# Patient Record
Sex: Female | Born: 2000 | Race: White | Hispanic: No | Marital: Single | Smoking: Never smoker
Health system: Southern US, Community
[De-identification: ages and names within clinical notes are randomized; demographics above are authoritative.]

## PROBLEM LIST (undated history)

## (undated) DIAGNOSIS — G43909 Migraine, unspecified, not intractable, without status migrainosus: Secondary | ICD-10-CM

## (undated) HISTORY — PX: KNEE ARTHROSCOPY WITH POSTERIOR CRUCIATE LIGAMENT (PCL) RECONSTRUCTION: SHX5657

---
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
## 2020-01-17 ENCOUNTER — Other Ambulatory Visit: Payer: Self-pay

## 2020-01-17 ENCOUNTER — Encounter: Payer: Medicaid Other | Attending: Pediatrics | Admitting: Registered"

## 2020-01-17 ENCOUNTER — Encounter: Payer: Self-pay | Admitting: Registered"

## 2020-01-17 DIAGNOSIS — E663 Overweight: Secondary | ICD-10-CM | POA: Diagnosis present

## 2020-01-17 DIAGNOSIS — E7800 Pure hypercholesterolemia, unspecified: Secondary | ICD-10-CM | POA: Diagnosis not present

## 2020-01-17 NOTE — Progress Notes (Signed)
Medical Nutrition Therapy:  Appt start time: 9983 end time:  1507.  Assessment:  Primary concerns today: Pt referred for weight management and elevated cholesterol. Pt present for appointment alone.  Pt reports her mother is usually the one to ask questions at pt's appointments. Reports when the doctor called she figured she may have HTN or HLD due to family hx. Pt reports her mother has been making changes to the foods they have to help prevent heart disease and diabetes due to their family hx including both. Reports mother using liquid cooking oils in place of butter and trying to include lean proteins.   Pt reports she struggles with trying new vegetables because she was pressured to eat them when she was a child and did not like them. Pt is open to trying some new vegetables now.   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 Pt reports she is currently doing well since finding out what foods irritate her GI and working to limit/avoid those foods. Pt reports spicy, greasy/high fat, and acidic foods such as tomato sauce as well as soda (carbonated beverages) were causing a lot of stomach issues. Also has issues if she consumes too much milk at one time. Pt has not tried lactose free milk or any milk alternatives. Pt reports she made these change over past 1.5 years and has been feeling much better. Pt reports she was told she probably had SIBO since age 57. Reports if her family is having spaghetti or pizza she will limit portions. Reports sweets sometimes trigger migraines if in large amounts. Pt reports things like nuts and nut butters are well tolerated.   Pt reports she was told to get in 3 meals by her doctor but struggles to do so due to not being used to eating reguarly in the past. Reports she is trying to get used to regular pattern of eating. Pt reports she is supposed to take a multivitamin but hasn't been taking it regularly. Reports the multivitamin contains iron and  calcium.   Pt is doing virtual school. Reports she is on spring break this week.   Food Allergies/Intolerances: No food allergies. Pt reports spicy, acidic, greasy/high fat foods and carbonated drinks irritate GI. Limits amount of dairy at one time as well.   GI Concerns: None currently. Hx of SIBO and several foods that cause GI discomfort but reports feeling much better over past 1.5 years since making dietary changes.   Pertinent Lab Values: Pending additional labs.  11/15/19:  Hemoglobin: 11.9  Weight Hx: See growth chart.   Preferred Learning Style:   No preference indicated   Learning Readiness:   Ready  MEDICATIONS: See list.    DIETARY INTAKE:  Usual eating pattern includes 2 meals and 1 snack per day. Pt reports getting in 3 meals has been a struggle due to hx with GI problems. Reports she is just now getting used to being able to eat regularly. Reports variances in appetite.   Common foods: foods vary.  Avoided foods: chips (limits), spicy (limits), acidic (limits), greasy foods (limits), carbonated beverages (limits).  Liked vegetables: carrots, green beans.   Typical Snacks: popcorn, granola bar, yogurt, gummies.   Typical Beverages: water.   Location of Meals: with family.  Electronics Present at [employer]: Yes: TV in background  24-hr recall:  B ( AM): 2 pancakes with syrup, bowl of tangerines, chocolate milk (reports started feeling a little sick) Snk ( AM): None reported.  L ( PM): 1  slice [employer] pizza pepperoni, bottle of water (felt a little nauseous)  Snk ( PM): None reported.  D ( PM): tacos x 2-3 hard and soft with beef, lettuce, cheese, sweet tea (no GI issues)  Snk ( PM): handful gummy bears  Beverages: chocolate milk (part of cup), half of a bottle soda, ~ 8 oz sweet tea, 2-3 bottles water/day   Usual physical activity: running around with dogs; no planned activities; walks at park with siblings. Minutes/Week: 2-3 days x 1.5 hours  per week.   Progress Towards Goal(s):  In progress.   Nutritional Diagnosis:  NI-5.11.1 Predicted suboptimal nutrient intake As related to skipping meals, inadequate intake of vegetables.  As evidenced by pt's reported dietary recall and habits .    Intervention:  Nutrition counseling provided. Dietitian provided education regarding heart healthy nutrition and nutrition to promote healthy microbiota. Discussed continuing to limit foods found to trigger GI discomfort. Discussed benefits of physical activity on heart health-recommended consulting pt's doctor with any questions/concerns about if an activity is appropriate for knee. Discussed goal for trying new vegetables. Discussed having protein with carbohydrate snacks which may slow blood sugar rise and prevent spiking which could possibly be triggering migraines. Discussed importance of getting in multiple meals per day to help with preventing GI discomfort. Discussed trying a lactose free milk as lactose intolerance is common with SIBO. Recommended multivitamin with iron. Pt appeared agreeable to information/goals discussed.   Instructions/Goals:  Make sure to get in three meals per day. Try to have balanced meals like the My Plate example (see handout). Include lean proteins, vegetables, fruits, and whole grains at meals.    Have 3 meals: If unable to get a full meal, have a balanced snack (see handout). Eat every 3-5 hours while awake.   If having a carbohydrate or sweet, balance with protein which may help prevent migraines    Continue working to limit high fat foods, especially those high in saturated fats (see handout), and others that cause GI distress. Include those with good sources of unsaturated fat as main fat source.    Recommend setting out to try 1-2 new vegetables every 1-2 weeks.   -Recommend trying a lactose free milk  Lactaid   Soy milk  Pea Milk   Silk Protein Plus  -Continue including yogurt with healthy  bacteria. Can also try Kefir drinks  -Recommend a daily multivitamin with iron.   -Recommend ~64 oz water (4 bottles) daily  -Continue working to increase physical activity. Trying for at least 30 minutes most days of the week is a great goal.   Teaching Method Utilized:  Visual Auditory  Handouts given during visit include:  Balanced plate and food list  Heart Healthy Nutrition   Balanced Snacks.   Barriers to learning/adherence to lifestyle change: None indicated.  Demonstrated degree of understanding via:  Teach Back   Monitoring/Evaluation:  Dietary intake, exercise,and body weight in 2 month(s).

## 2020-01-17 NOTE — Patient Instructions (Addendum)
Instructions/Goals:  Make sure to get in three meals per day. Try to have balanced meals like the My Plate example (see handout). Include lean proteins, vegetables, fruits, and whole grains at meals.    Have 3 meals: If unable to get a full meal, have a balanced snack (see handout). Eat every 3-5 hours while awake.   If having a carbohydrate or sweet, balance with protein which may help prevent migraines    Continue working to limit high fat foods, especially those high in saturated fats (see handout), and others that cause GI distress. Include those with good sources of unsaturated fat as main fat source.    Recommend setting out to try 1-2 new vegetables every 1-2 weeks.   -Recommend trying a lactose free milk  Lactaid   Soy milk  Pea Milk   Silk Protein Plus  -Continue including yogurt with healthy bacteria. Can also try Kefir drinks  -Recommend a daily multivitamin with iron.   -Recommend ~64 oz water (4 bottles) daily  -Continue working to increase physical activity. Trying for at least 30 minutes most days of the week is a great goal.

## 2020-03-20 ENCOUNTER — Encounter: Payer: Self-pay | Admitting: Registered"

## 2020-03-20 ENCOUNTER — Encounter: Payer: Medicaid Other | Attending: Pediatrics | Admitting: Registered"

## 2020-03-20 ENCOUNTER — Other Ambulatory Visit: Payer: Self-pay

## 2020-03-20 DIAGNOSIS — E663 Overweight: Secondary | ICD-10-CM | POA: Diagnosis present

## 2020-03-20 DIAGNOSIS — E7800 Pure hypercholesterolemia, unspecified: Secondary | ICD-10-CM | POA: Diagnosis present

## 2020-03-20 NOTE — Progress Notes (Signed)
Medical Nutrition Therapy:  Appt start time: 5681 end time:  1435.  Assessment:  Primary concerns today: Pt referred for weight management and elevated cholesterol.   Nutrition Follow-Up: Pt present for appointment alone.  Pt reports she has started the supplement with iron. Reports she has been having more energy since getting in 3 meals per day. Reports she started with 2 meals daily and then worked to 3 meals. Reports a program at school helped her get in 3 meals and since she finished school, if she doesn't stop to eat she will get hungry or get a headache so that has encouraged pt to keep up the 3 meals per day. Reports she is drinking mostly water, very little soda. Reports around 5 bottles water daily. Reports improvement in migraines since eating more often and having less soda. Reports things have been easier without the school stress, last day was last Sunday. Reports being very happy to learn she will be graduating. Reports having a lot of struggle with school work over past 4 years and glad she was able to pass everything to graduate. Pt reports her step father was dx with stage 4 throat cancer recently which has been difficult. Reports her mother had lung cancer in the past.   Pt reports she tried some new vegetables as discussed at last visit. She tried green peppers, cucumbers (used to hate as younger child), and radishes. Reports her mother is helping encourage her to try more vegetables as well. Reports she has now had the vegetables more than once. Reports they were ok, different. Pt reports her mother has been trying to help her make positive changes so she won't have to be put on cholesterol medication.   Pt reports she tried Kefir on Sunday and almond milk. Reports doing better on her stomach than regular milk. Reports the kefir was not bad. Plans to have it again. Pt reports she eats yogurt but not daily. Reports she does not consistently have dairy each day.   Food  Allergies/Intolerances: No food allergies. Pt reports spicy, acidic, greasy/high fat foods and carbonated drinks irritate GI. Limits amount of dairy at one time as well.   GI Concerns: None currently. Hx of SIBO and several foods that cause GI discomfort but reports feeling much better over past 1.5 years since making dietary changes. Pt reports occasional stomach pain but feels it is stress induced. Denies any other GI issues.   Pertinent Lab Values: Pending additional labs.  11/15/19:  Hemoglobin: 11.9  Weight Hx: See growth chart.   Preferred Learning Style:   No preference indicated   Learning Readiness:   Ready  MEDICATIONS: See list.    DIETARY INTAKE:  Usual eating pattern includes 3 meals and 1-2 snacks per day.   Common foods: foods vary.  Avoided foods: chips (limits), spicy (limits), acidic (limits), greasy foods (limits), carbonated beverages (limits).  Liked vegetables: carrots, green beans.    Typical Snacks: popcorn, trail mix, granola bar, yogurt, gummies.   Typical Beverages: water.   Location of Meals: with family.  Electronics Present at [employer]: Yes: TV in background  24-hr recall:  B ( AM): 1 blueberry muffin, apple slices  Snk ( AM): None reported.  L ( PM): mac and cheese, carrots Snk ( PM): trail mix (cashews, almonds, peanuts, chocolate) D ( PM): ham, mustard sandwich on white bread Snk ( PM): 1 pack dark chocolate cream Belvita cookies Beverages: 4 bottles water   Usual physical activity: running around with dogs;  no planned activities; walks at park with siblings. Minutes/Week: Reports 30-45 minutes per day walking dog. Reports activity has increased since last appointment.   Progress Towards Goal(s):  Some progress. Pt now getting in 3 meals most days and tried multiple new vegetables.    Nutritional Diagnosis:  NI-5.11.1 Predicted suboptimal nutrient intake As related to skipping meals, inadequate intake of vegetables.  As evidenced by  pt's reported dietary recall and habits .    Intervention:  Nutrition counseling provided. Praised pt's progress with getting in 3 meals, more water, and trying the kefir, alternative milk, and new vegetables. Discussed continuing with previously discussed goals. Recommended adding a calcium supplement, 500 mg 1 time daily if having some dairy each day and 2 separately daily if not getting in any dairy sources some days. Pt appeared agreeable to information/goals discussed.   Instructions/Goals:  Make sure to get in three meals per day. Try to have balanced meals like the My Plate example (see handout). Include lean proteins, vegetables, fruits, and whole grains at meals.    Have 3 meals: If unable to get a full meal, have a balanced snack (see handout). Eat every 3-5 hours while awake.Great job having 3 meals per day!! Keep it up!!   Continue working to limit high fat foods, especially those high in saturated fats (see handout), and others that cause GI distress. Include those with good sources of unsaturated fat as main fat source.    Recommend setting out to try 1-2 new vegetables every 1-2 weeks. Great job trying new vegetables!   -Recommend trying a lactose free milk Good job trying the almond milk!  Lactaid   Soy milk  Pea Milk   Silk Protein Plus  -Continue including yogurt with healthy bacteria. Can also try Kefir drinks Great job trying Kefir!!  -Recommend a daily multivitamin with iron. Great job!   -Recommend including 500 mg calcium if unable to get in 3 servings of dairy per day but still getting some dairy daily and recommend 500 mg twice daily separately if not having any dairy some days. Take separately from when you take the iron multivitamin.   -Recommend ~64 oz water (4 bottles) daily Doing awesome with your water!!  -Continue working to increase physical activity. Trying for at least 30 minutes most days of the week is a great goal. Wonderful job!   Teaching  Method Utilized:  Visual Auditory  Barriers to learning/adherence to lifestyle change: None indicated.  Demonstrated degree of understanding via:  Teach Back   Monitoring/Evaluation:  Dietary intake, exercise,and body weight in 3 month(s).

## 2020-03-20 NOTE — Patient Instructions (Addendum)
Instructions/Goals:  Make sure to get in three meals per day. Try to have balanced meals like the My Plate example (see handout). Include lean proteins, vegetables, fruits, and whole grains at meals.    Have 3 meals: If unable to get a full meal, have a balanced snack (see handout). Eat every 3-5 hours while awake.Great job having 3 meals per day!! Keep it up!!   Continue working to limit high fat foods, especially those high in saturated fats (see handout), and others that cause GI distress. Include those with good sources of unsaturated fat as main fat source.    Recommend setting out to try 1-2 new vegetables every 1-2 weeks. Great job trying new vegetables!   -Recommend trying a lactose free milk Good job trying the almond milk!  Lactaid   Soy milk  Pea Milk   Silk Protein Plus  -Continue including yogurt with healthy bacteria. Can also try Kefir drinks Great job trying Kefir!!  -Recommend a daily multivitamin with iron. Great job!   -Recommend including 500 mg calcium if unable to get in 3 servings of dairy per day but still getting some dairy daily and recommend 500 mg twice daily separately if not having any dairy some days. Take separately from when you take the iron multivitamin.   -Recommend ~64 oz water (4 bottles) daily Doing awesome with your water!!  -Continue working to increase physical activity. Trying for at least 30 minutes most days of the week is a great goal. Wonderful job!

## 2020-06-17 ENCOUNTER — Encounter: Payer: Medicaid Other | Attending: Pediatrics | Admitting: Registered"

## 2020-06-17 ENCOUNTER — Encounter: Payer: Self-pay | Admitting: Registered"

## 2020-06-17 ENCOUNTER — Other Ambulatory Visit: Payer: Self-pay

## 2020-06-17 DIAGNOSIS — E7800 Pure hypercholesterolemia, unspecified: Secondary | ICD-10-CM | POA: Diagnosis not present

## 2020-06-17 DIAGNOSIS — E663 Overweight: Secondary | ICD-10-CM | POA: Diagnosis present

## 2020-06-17 NOTE — Patient Instructions (Signed)
Instructions/Goals:  Make sure to get in three meals per day. Try to have balanced meals like the My Plate example (see handout). Include lean proteins, vegetables, fruits, and whole grains at meals.    Have 3 meals: If unable to get a full meal, have a balanced snack (see handout). Eat every 3-5 hours while awake.  Plan out easy go to foods for breakfast and lunch so you can easily still get in your meals even on busy days. Start with prepping 2 meals per week.    Continue working to limit high fat foods, especially those high in saturated fats (see handout), and others that cause GI distress. Include those with good sources of unsaturated fat as main fat source.    Recommend setting out to try 1-2 new vegetables every 1-2 weeks.  Continue working to include more non-starchy vegetables.   -Recommend trying a lactose free milk Continue working to get in more dairy!  Lactaid   Soy milk  Pea Milk   Silk Protein Plus   -Recommend a daily multivitamin with iron. Great job!   -Recommend including 500 mg calcium if unable to get in 3 servings of dairy per day but still getting some dairy daily and recommend 500 mg twice daily separately if not having any dairy some days. Take separately from when you take the iron multivitamin. ** May do Tums if easier than regular calcium supplements. **  -Recommend ~64 oz water (4 bottles) daily Doing awesome with your water!!  -Continue working to increase physical activity. Trying for at least 30 minutes most days of the week is a great goal. [employer] job!

## 2020-06-17 NOTE — Progress Notes (Signed)
Medical Nutrition Therapy:  Appt start time: 1403 end time:  1435.  Assessment:  Primary concerns today: Pt referred for weight management and elevated cholesterol.   Nutrition Follow-Up: Pt present for appointment alone.  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 Reports work has become a little harder but feels she is getting the hang of things.   Pt reports starting the program at school has made eating regular more difficult. Trying to get back on track. Reports she would get busy with school and not eat at regular times. Reports typically eating 2 times daily, sometimes 3. Still trying to get in the 3 times. Reports struggling with emotional eating and choosing snack like foods rather than healthier foods. Feels this occurs more so when she is skipping meals and stressed with getting assignments done. Reports she feels meal prepping would help. Pt reports she wishes she had gotten in a habit of meal prepping when she was in high school.  Reports having acid reflux sometimes, feels due to eating something too spicy. Reports she now knows to continue to avoid eating too much spicy food. Otherwise no GI issues.   Reports still including more vegetables-reports mother always includes a vegetable with dinner when she cooks. Reports mother plans to try out more variety and wants to help siblings be more accepting of vegetables as well. Reports water intake is going great, occasional soda but rare. Reports increasing her water intake has helped a lot with migraines (none in 2 months). Pt reports drinking about 5 bottles daily (~80 oz water).   Reports not taking the calcium supplement but including more yogurt (around daily) and almond milk (3 times per week). Pt continues taking her multivitamin with iron.   Food Allergies/Intolerances: No food allergies. Pt reports  spicy, acidic, greasy/high fat foods and carbonated drinks irritate GI. Limits amount of dairy at one time as well.   GI Concerns: None currently. Hx of SIBO and several foods that cause GI discomfort but reports feeling much better over past 1.5 years since making dietary changes. Reports having acid reflux sometimes, feels due to eating something too spicy. Reports she now knows to continue to avoid eating too much spicy food. Otherwise no GI issues.   Pertinent Lab Values: Pending additional labs.  11/15/19:  Hemoglobin: 11.9  Weight Hx: See growth chart.   Preferred Learning Style:   No preference indicated   Learning Readiness:   Ready  MEDICATIONS: See list.    DIETARY INTAKE:  Usual eating pattern includes 2-3 meals and 1-2 snacks per day.   Common foods: foods vary.  Avoided foods: chips (limits), spicy (limits), acidic (limits), greasy foods (limits), carbonated beverages (limits).  Liked vegetables: carrots, green beans, cucumbers.    Typical Snacks: popcorn, trail mix, granola bar, yogurt, gummies.   Typical Beverages: water.   Location of Meals: with family.  Electronics Present at [employer]: Yes: TV in background  24-hr recall:  B ( AM): toast with butter and apple, apple juice  Snk ( AM): None reported.  L (1230 PM): [employer] brand vanilla yogurt with granola, water  Snk ( PM): None reported.  D ( PM): [place] and [place] steak, rice, dinner rolls, corn, water  Snk ( PM): None reported.  Beverages: 5 bottles water daily, apple juice  Usual physical activity: outline workouts; Minutes/Week: Reports 30-45 minutes x 3  times weekly (dance and workout videos); sometimes walks dog   Progress Towards Goal(s):  Some progress.    Nutritional Diagnosis:  NI-5.11.1 Predicted suboptimal nutrient intake As related to skipping meals, inadequate intake of vegetables.  As evidenced by pt's reported dietary recall and habits .    Intervention:  Nutrition counseling  provided. Praised pt for continuing with regular water intake and trying more vegetables. Discussed how great it is that pt is being a good role model to her family with increasing their vegetable intake as well. Also praised pt for including more physical activity. Discussed prepping at least 2 meals ahead of time to have on hand in refrigerator for times she gets busy and doesn't have time to put together a meal. Discussed ideas for go to meals. Discussed how skipping can increase cravings and thus emotional/stress eating. Discussed that pt can do Tums in place of other calcium supplements and went over recommendation for amounts depending on dairy intake.  Pt appeared agreeable to information/goals discussed.   Instructions/Goals:  Make sure to get in three meals per day. Try to have balanced meals like the My Plate example (see handout). Include lean proteins, vegetables, fruits, and whole grains at meals.    Have 3 meals: If unable to get a full meal, have a balanced snack (see handout). Eat every 3-5 hours while awake.  Plan out easy go to foods for breakfast and lunch so you can easily still get in your meals even on busy days. Start with prepping 2 meals per week.    Continue working to limit high fat foods, especially those high in saturated fats (see handout), and others that cause GI distress. Include those with good sources of unsaturated fat as main fat source.    Recommend setting out to try 1-2 new vegetables every 1-2 weeks.  Continue working to include more non-starchy vegetables.   -Recommend trying a lactose free milk Continue working to get in more dairy!  Lactaid   Soy milk  Pea Milk   Silk Protein Plus   -Recommend a daily multivitamin with iron. Great job!   -Recommend including 500 mg calcium if unable to get in 3 servings of dairy per day but still getting some dairy daily and recommend 500 mg twice daily separately if not having any dairy some days. Take  separately from when you take the iron multivitamin. ** May do Tums if easier than regular calcium supplements. **  -Recommend ~64 oz water (4 bottles) daily Doing awesome with your water!!  -Continue working to increase physical activity. Trying for at least 30 minutes most days of the week is a great goal. [employer] job!  Teaching Method Utilized:  Visual Auditory  Barriers to learning/adherence to lifestyle change: None indicated.  Demonstrated degree of understanding via:  Teach Back   Monitoring/Evaluation:  Dietary intake, exercise,and body weight in 2 month(s).

## 2020-08-19 ENCOUNTER — Encounter: Payer: Medicaid Other | Attending: Pediatrics | Admitting: Registered"

## 2020-08-19 ENCOUNTER — Other Ambulatory Visit: Payer: Self-pay

## 2020-08-19 ENCOUNTER — Encounter: Payer: Self-pay | Admitting: Registered"

## 2020-08-19 DIAGNOSIS — E7800 Pure hypercholesterolemia, unspecified: Secondary | ICD-10-CM | POA: Diagnosis not present

## 2020-08-19 NOTE — Progress Notes (Signed)
Medical Nutrition Therapy:  Appt start time: 0810 end time:  0841.  Assessment:  Primary concerns today: Pt referred for weight management and elevated cholesterol.   Nutrition Follow-Up: Pt present for appointment alone.  Pt reports eating has improved to eating 3 times daily most days of the week. Reports coming off of nexplanon and changing to another birth control medication has helped her with being able to eat better and have more energy. Reports she switched birth control medications 4 weeks ago. About 2 months ago went to ER due to vomiting and unable to keep foods down as well as spotting and reports changing medication corrected this issue.   Reports she has still been working to include more vegetables, if she doesn't have vegetables at one meal tries to have more with the next meal. Reports water intake going well, currently over 64 oz daily. Reports rare to have a soda now, sometimes apple or cranberry juice. Sometimes a little sweet tea due to wanting a little caffeine. Reports being more thirsty since she has increased her physical activity. Pt is now including 30-60 minute video workouts most days of the week. Pt reports she has been including her younger brother ([id number]) in her workouts because they have been concerned about his wt and not being very active.   Pt continues with her multivitamin with iron and has added a calcium supplement as discussed, 500 mg 1-2 times per day. Dose depends on if she has dairy or not that day. Sometimes has alternative milk or yogurt.   Pt reports she has lost around 5 lb since she started making changes. Reports she was encouraged to see this change.   Food Allergies/Intolerances: No food allergies. Pt reports spicy, acidic, greasy/high fat foods and carbonated drinks irritate GI. Limits amount of dairy at one time as well.   GI Concerns: None currently. Hx of SIBO and several foods that cause GI discomfort but reports feeling much better over past 1.5  years since making dietary changes. Reports no GI issues recently. Occasional acid reflux but reports taking Tums sometimes to manage.   Pertinent Lab Values: Pending additional labs.  11/15/19:  Hemoglobin: 11.9  Weight Hx: See growth chart.   Preferred Learning Style:   No preference indicated   Learning Readiness:   Ready  MEDICATIONS: See list.    DIETARY INTAKE:  Usual eating pattern includes 3 meals and 1-2 snacks per day.   Common foods: foods vary.  Avoided foods: chips (limits), spicy (limits), acidic (limits), greasy foods (limits), carbonated beverages (limits).  Liked vegetables: carrots, green beans, cucumbers.    Typical Snacks: popcorn, nuts, trail mix, granola bar, yogurt, gummies.   Typical Beverages: mostly water; juice; sometimes sweet tea.   Location of Meals: with family.  Electronics Present at [employer]: Yes: TV in background  24-hr recall: Reports this day was a little different than usual d/t weekend and Halloween.  B ( AM): 2 pieces white toast with orange, apple juice  Snk ( AM): peanuts  L (PM): bagel bites x 5, carrots, water   Snk ( PM): None reported.  D ( PM): [employer]: chicken tenders, fries, sweet tea, [place] Toast, grapes (dinner different than usual with Halloween)  Snk ( PM): None reported.  Beverages: apple juice, sweet tea, water (at least +64 oz water)   Usual physical activity: cardio videos; Minutes/Week: Reports 30-60 minutes x most days per week (workout videos). Reports thirst has come down some since getting used to being more active  but at first it was up.  Progress Towards Goal(s):  Some progress.    Nutritional Diagnosis:  NI-5.11.1 Predicted suboptimal nutrient intake As related to skipping meals, inadequate intake of vegetables.  As evidenced by pt's reported dietary recall and habits .    Intervention:  Nutrition counseling provided. Praised pt for working to have 3 meals most days, incorporating more vegetables and  physical activity and continuing with regular water intake. Discussed letting her doctor know if she continues feeling more thirsty than usual. Discussed ways to add protein to breakfast (peanut butter with toast, nuts on side, Greek yogurt, etc). Pt appeared agreeable to information/goals discussed.   Instructions/Goals:  Make sure to get in three meals per day. Try to have balanced meals like the My Plate example (see handout). Include lean proteins, vegetables, fruits, and whole grains at meals.    Have 3 meals: If unable to get a full meal, have a balanced snack (see handout). Eat every 3-5 hours while awake.  Great job with getting 3 meals most days! Continue working to eat regularly!   Try to include some protein with breakfast.    Continue working to limit high fat foods, especially those high in saturated fats (see handout), and others that cause GI distress. Include those with good sources of unsaturated fat as main fat source. Glad to hear your GI symptoms have improved!    Recommend setting out to try 1-2 new vegetables every 1-2 weeks.  Continue working to include more non-starchy vegetables. Great job!  -Recommend trying a lactose free milk Continue working to get in more dairy!  Lactaid   Soy milk  Pea Milk   Silk Protein Plus   -Recommend a daily multivitamin with iron. Great job!   -Recommend including 500 mg calcium if unable to get in 3 servings of dairy per day but still getting some dairy daily and recommend 500 mg twice daily separately if not having any dairy some days. Take separately from when you take the iron multivitamin. May do Tums if easier than regular calcium supplements. Great job!   -Recommend ~64 oz water (4 bottles) daily Doing awesome with your water!!  -Continue working to increase physical activity. Trying for at least 30 minutes most days of the week is a great goal. [employer] job!  Teaching Method Utilized:  Visual Auditory  Barriers to  learning/adherence to lifestyle change: None indicated.  Demonstrated degree of understanding via:  Teach Back   Monitoring/Evaluation:  Dietary intake, exercise,and body weight in 3 month(s).

## 2020-08-19 NOTE — Patient Instructions (Signed)
Instructions/Goals:  Make sure to get in three meals per day. Try to have balanced meals like the My Plate example (see handout). Include lean proteins, vegetables, fruits, and whole grains at meals.    Have 3 meals: If unable to get a full meal, have a balanced snack (see handout). Eat every 3-5 hours while awake.  Great job with getting 3 meals most days! Continue working to eat regularly!   Try to include some protein with breakfast.    Continue working to limit high fat foods, especially those high in saturated fats (see handout), and others that cause GI distress. Include those with good sources of unsaturated fat as main fat source. Glad to hear your GI symptoms have improved!    Recommend setting out to try 1-2 new vegetables every 1-2 weeks.  Continue working to include more non-starchy vegetables. Great job!  -Recommend trying a lactose free milk Continue working to get in more dairy!  Lactaid   Soy milk  Pea Milk   Silk Protein Plus   -Recommend a daily multivitamin with iron. Great job!   -Recommend including 500 mg calcium if unable to get in 3 servings of dairy per day but still getting some dairy daily and recommend 500 mg twice daily separately if not having any dairy some days. Take separately from when you take the iron multivitamin. May do Tums if easier than regular calcium supplements. Great job!   -Recommend ~64 oz water (4 bottles) daily Doing awesome with your water!!  -Continue working to increase physical activity. Trying for at least 30 minutes most days of the week is a great goal. [employer] job!

## 2020-11-25 ENCOUNTER — Encounter: Payer: Self-pay | Admitting: Registered"

## 2020-11-25 ENCOUNTER — Encounter: Payer: Medicaid Other | Attending: Pediatrics | Admitting: Registered"

## 2020-11-25 ENCOUNTER — Other Ambulatory Visit: Payer: Self-pay

## 2020-11-25 DIAGNOSIS — E7800 Pure hypercholesterolemia, unspecified: Secondary | ICD-10-CM | POA: Diagnosis not present

## 2020-11-25 NOTE — Patient Instructions (Addendum)
Instructions/Goals:  Make sure to get in three meals per day. Try to have balanced meals like the My Plate example (see handout). Include lean proteins, vegetables, fruits, and whole grains at meals.    Have 3 meals: If unable to get a full meal, have a balanced snack (see handout). Eat every 3-5 hours while awake.  Continue working to get in 3 meals. If having a snack in place of a meal at least include energy and protein (see handout)  Recommend having soy milk with your snack to provide more protein   Continue working to limit high fat foods, especially those high in saturated fats (see handout), and others that cause GI distress. Include those with good sources of unsaturated fat as main fat source. Glad to hear your GI symptoms are still improved!    Recommend setting out to try 1-2 new vegetables every 1-2 weeks.  Continue working to include more non-starchy vegetables. Try for at least 1 non-starchy vegetable per day, 1 per meal even better!  -Recommend trying a lactose free milk Continue working to get in more dairy! Try for some each day, 2-3 servings recommended. Include calcium 500 mg 1-2 times daily if unable to get at least 2 servings most days. Take separate from iron supplement.   Lactaid   Soy milk  Pea Milk   Silk Protein Plus   -Recommend a daily multivitamin with iron. Great job!   -Recommend ~64 oz water (4 bottles) daily Continue doing well with water!   -Continue working to increase physical activity. Trying for at least 30 minutes most days of the week is a great goal. Doing great!

## 2020-11-25 NOTE — Progress Notes (Signed)
Medical Nutrition Therapy:  Appt start time: 0813 end time:  0850.  Assessment:  Primary concerns today: Pt referred for weight management and elevated cholesterol.   Nutrition Follow-Up: Pt present for appointment alone.  Pt reports she had good holidays. Reports taking multivitamin with iron, but no longer taking calcium supplement due to now eating dairy more often. Reports she has been eating yogurt which had done well for her GI. Reports she tried a string cheese stick but this did not sit well. Reports she can drink cow's milk in limited quantities. Has tried soy and almond milk and likes those ok but she is only person in home that drinks them so hard to not waste it.   Pt reports things are going good overall, but feeling stressed due to exams. Reports lower appetite while feeling stressed. Reports eating breakfast and dinner but may skip lunch. She tries to get in a snack (popcorn OR Goldfish) if not having lunch. Reports water going well, occasional soda but not every week and when she does have it often does not finish it. Reports getting in at least 60 oz water daily. Reports improvement in migraines since drinking more water and much less soda. Pt reports cutting out soda has been a long-term goal of hers because she can tell they trigger her migraines and she is very happy with her progress in reaching this goal.   Pt reports venturing out a little with vegetables at Christmas but did like the new vegetable she tried (thinks it was squash but didn't like how it was prepared). Reports having a vegetable almost daily, sometimes every other day. Reports she has talked with her mother about working in more protein at [employer]. Pt reports she does like fruit.   Pt reports having some nausea over past week, but feels it may be due to stress. Pt reports school being stressful right now and also her mother has been looking for a new home. Pt's school finishes in March. 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   Pt reports she reduced how often she does the cardio workout video due to dx of yeast infection since last appointment. Reports she was taking antibiotics which she think led to the yeast infection. Has been gradually adding in more activity again. Reports 2 days x 30-60 minutes with the video and walking dog 3 days x 15-60 minutes.   Food Allergies/Intolerances: No food allergies. Pt reports spicy, acidic, greasy/high fat foods and carbonated drinks irritate GI. Limits amount of dairy at one time as well.   GI Concerns: None currently. Hx of SIBO and several foods that cause GI discomfort but reports feeling much better over past 1.5 years since making dietary changes. Reports no GI issues recently. Occasional acid reflux but reports taking Tums sometimes to manage.   Pertinent Lab Values:  11/15/19:  Hemoglobin: 11.9  Weight Hx: See growth chart.   Preferred Learning Style:   No preference indicated   Learning Readiness:   Ready  MEDICATIONS: See list.    DIETARY INTAKE:  Usual eating pattern includes 2-3 meals and 1-2 snacks per day.   Common foods: foods vary.  Avoided foods: chips (limits), spicy (limits), acidic (limits), greasy foods (limits), carbonated beverages (limits).  Liked vegetables: carrots, green beans, cucumbers.    Typical Snacks: popcorn, nuts, trail mix, granola bar, yogurt, gummies.   Typical Beverages: mostly water; juice; occasional soda.   Location of Meals: with family.  Electronics Present at [employer]: Yes: TV in background  24-hr recall:  B ( AM): scrambled eggs, 1 piece white toast, 1 cup orange juice Snk ( AM): peanuts, water   L (PM): half peanut butter sandwich, water  Snk ( PM): None reported.   D ( PM): mac and cheese, baked beans, corn, Spam, water Snk ( PM): None reported.  Beverages: ~60 oz water, 1 cup orange juice.   Usual physical activity: cardio videos; Minutes/Week: Reports  slowed down d/t yeast infection. Slowly getting back and has been walking dog more x 3 times per week x 15-60 minutes. Includes workout video 2 times/week x 30-60 minutes.   Progress Towards Goal(s):  Some progress.    Nutritional Diagnosis:  NI-5.11.1 Predicted suboptimal nutrient intake As related to skipping meals, inadequate intake of vegetables.  As evidenced by pt's reported dietary recall and habits .    Intervention:  Nutrition counseling provided. Praised pt meeting water needs and getting in regular physical activity. Discussed if unable to get in a meal at lunch to at least include a carbohydrate + protein. Encouraged pt to continue with working in more vegetables. Discussed dairy-if unable to get in 2 servings most days recommend 1-2 x 500 mg daily as discussed. Discussed having next appointment after pt gets out of school and transitions to a job environment to check in with how goals are going with life changes. Pt appeared agreeable to information/goals discussed.   Instructions/Goals:  Make sure to get in three meals per day. Try to have balanced meals like the My Plate example (see handout). Include lean proteins, vegetables, fruits, and whole grains at meals.    Have 3 meals: If unable to get a full meal, have a balanced snack (see handout). Eat every 3-5 hours while awake.  Continue working to get in 3 meals. If having a snack in place of a meal at least include energy and protein (see handout)  Recommend having soy milk with your snack to provide more protein   Continue working to limit high fat foods, especially those high in saturated fats (see handout), and others that cause GI distress. Include those with good sources of unsaturated fat as main fat source. Glad to hear your GI symptoms are still improved!    Recommend setting out to try 1-2 new vegetables every 1-2 weeks.  Continue working to include more non-starchy vegetables. Try for at least 1 non-starchy vegetable  per day, 1 per meal even better!  -Recommend trying a lactose free milk Continue working to get in more dairy! Try for some each day, 2-3 servings recommended. Include calcium 500 mg 1-2 times daily if unable to get at least 2 servings most days. Take separate from iron supplement.   Lactaid   Soy milk  Pea Milk   Silk Protein Plus   -Recommend a daily multivitamin with iron. Great job!   -Recommend ~64 oz water (4 bottles) daily Continue doing well with water!   -Continue working to increase physical activity. Trying for at least 30 minutes most days of the week is a great goal. Doing great!  Teaching Method Utilized:  Visual Auditory  Barriers to learning/adherence to lifestyle change: None indicated.  Demonstrated degree of understanding via:  Teach Back   Monitoring/Evaluation:  Dietary intake, exercise,and body weight in 3 month(s).

## 2021-02-24 ENCOUNTER — Ambulatory Visit: Payer: Medicaid Other | Admitting: Registered"

## 2021-03-01 ENCOUNTER — Other Ambulatory Visit: Payer: Self-pay

## 2021-03-01 DIAGNOSIS — R0602 Shortness of breath: Secondary | ICD-10-CM | POA: Diagnosis not present

## 2021-03-01 DIAGNOSIS — Z711 Person with feared health complaint in whom no diagnosis is made: Secondary | ICD-10-CM | POA: Insufficient documentation

## 2021-03-01 DIAGNOSIS — M25512 Pain in left shoulder: Secondary | ICD-10-CM | POA: Insufficient documentation

## 2021-03-01 DIAGNOSIS — R0789 Other chest pain: Secondary | ICD-10-CM | POA: Diagnosis not present

## 2021-03-01 DIAGNOSIS — R058 Other specified cough: Secondary | ICD-10-CM | POA: Insufficient documentation

## 2021-03-01 NOTE — ED Triage Notes (Signed)
Reports sudden onset of left shoulder blade pain with sob and chest pain.  Also endorses having a cough about an hour ago.  Felt fine.  Reports the pain started before the cough.  Reports her mother has a hx of alpha 1 antitriptic defiency with multiple collapsed lungs.  Reports she hasn't been tested.

## 2021-03-02 ENCOUNTER — Encounter (HOSPITAL_BASED_OUTPATIENT_CLINIC_OR_DEPARTMENT_OTHER): Payer: Self-pay | Admitting: Emergency Medicine

## 2021-03-02 ENCOUNTER — Emergency Department (HOSPITAL_BASED_OUTPATIENT_CLINIC_OR_DEPARTMENT_OTHER): Payer: Medicaid Other

## 2021-03-02 ENCOUNTER — Emergency Department (HOSPITAL_BASED_OUTPATIENT_CLINIC_OR_DEPARTMENT_OTHER)
Admission: EM | Admit: 2021-03-02 | Discharge: 2021-03-02 | Disposition: A | Discharge status: Home / Self Care | Payer: Medicaid Other | Attending: Emergency Medicine | Admitting: Emergency Medicine

## 2021-03-02 DIAGNOSIS — Z711 Person with feared health complaint in whom no diagnosis is made: Secondary | ICD-10-CM

## 2021-03-02 DIAGNOSIS — R0789 Other chest pain: Secondary | ICD-10-CM

## 2021-03-02 DIAGNOSIS — R058 Other specified cough: Secondary | ICD-10-CM

## 2021-03-02 HISTORY — DX: Migraine, unspecified, not intractable, without status migrainosus: G43.909

## 2021-03-02 LAB — PREGNANCY, URINE: Preg Test, Ur: NEGATIVE

## 2021-03-02 NOTE — ED Provider Notes (Signed)
[facility] Provider Note: [name], MD, FACEP  CSN: [id number] MRN: [id number] ARRIVAL: 03/01/21 at 2350 ROOM: [facility]   CHIEF COMPLAINT  Shortness of Breath   HISTORY OF PRESENT ILLNESS  03/02/21 2:28 AM Linda Bailey is a 20 y.o. female who developed sudden onset of left shoulder and frontal chest pain about 1 AM.  She describes the pain as being dull and pressure-like in nature.  It was associated with shortness of breath and several paroxysms of cough.  The pain was worse with coughing or deep breathing.  The pain and other symptoms have gradually improved and are much better.  She rated her pain as a 7 out of 10 on arrival.  Her mother has a history of alpha-1 antitrypsin deficiency and multiple spontaneous pneumothoraces and that is her concern.  She has never been tested for alpha-1 antitrypsin deficiency.   Past Medical History:  Diagnosis Date  . Migraines     Past Surgical History:  Procedure Laterality Date  . KNEE ARTHROSCOPY WITH POSTERIOR CRUCIATE LIGAMENT (PCL) RECONSTRUCTION Right   . KNEE SURGERY  09/23/2017    Family History  Problem Relation Age of Onset  . Alpha-1 antitrypsin deficiency Mother   . Cancer Mother   . Hypertension Maternal Grandmother   . Diabetes Maternal Grandmother   . Hypertension Maternal Grandfather   . Diabetes Maternal Grandfather   . Hypertension Paternal Grandmother   . Diabetes Paternal Grandmother   . Hypertension Paternal Grandfather   . Diabetes Paternal Grandfather   . ADD / ADHD Sister   . ADD / ADHD Brother   . COPD Other   . Hyperlipidemia Other   . Heart attack Other   . Migraines Neg Hx   . Seizures Neg Hx   . Depression Neg Hx   . Anxiety disorder Neg Hx   . Bipolar disorder Neg Hx   . Schizophrenia Neg Hx   . Autism Neg Hx     Social History   Tobacco Use  . Smoking status: Never Smoker  . Smokeless tobacco: Never Used  Vaping Use  . Vaping Use: Never used  Substance Use  Topics  . Alcohol use: No  . Drug use: No    Prior to Admission medications   Medication Sig Start Date End Date Taking? Authorizing Provider  Multiple Vitamins-Minerals (MULTIVITAMIN ADULTS PO) Take by mouth.    [provider]  NORETHINDRONE PO Take by mouth.    [provider]  Lorita Officer Triphasic (NORGESTIMATE-ETHINYL ESTRADIOL PO) Take by mouth.    [provider]  rizatriptan (MAXALT-MLT) 10 MG disintegrating tablet Take 1 tablet (10 mg total) by mouth as needed for migraine. May repeat in 2 hours if needed 08/21/19   Margurite Auerbach, MD  esomeprazole (NEXIUM) 40 MG capsule Take 40 mg by mouth every evening.  12/31/17 03/02/21  [provider]  Etonogestrel (NEXPLANON [place]) Inject into the skin. Patient not taking: Reported on 11/25/2020  03/02/21  [provider]  promethazine (PHENERGAN) 25 MG tablet Take 1 tablet (25 mg total) by mouth every 6 (six) hours as needed for nausea or vomiting (headache). 09/30/18 03/02/21  Margurite Auerbach, MD    Allergies Clindamycin/lincomycin and Sulfa antibiotics   REVIEW OF SYSTEMS  Negative except as noted here or in the History of Present Illness.   PHYSICAL EXAMINATION  Initial Vital Signs Blood pressure (!) 92/49, pulse 89, temperature 98.4 F (36.9 C), temperature source Oral, resp. rate 18, height  5\' 2"  (1.575 m), weight 73.5 kg, last menstrual period 01/31/2021, SpO2 100 %.  Examination General: Well-developed, well-nourished female in no acute distress; appearance consistent with age of record HENT: normocephalic; atraumatic Eyes: pupils equal, round and reactive to light; extraocular muscles intact Neck: supple Heart: regular rate and rhythm Lungs: clear to auscultation bilaterally Chest: Nontender Abdomen: soft; nondistended; nontender; bowel sounds present Extremities: No deformity; full range of motion; pulses normal Neurologic: Awake, alert and oriented; motor function  intact in all extremities and symmetric; no facial droop Skin: Warm and dry Psychiatric: Normal mood and affect   RESULTS  Summary of this visit's results, reviewed and interpreted by myself:   EKG Interpretation  Date/Time:    Ventricular Rate:    PR Interval:    QRS Duration:   QT Interval:    QTC Calculation:   R Axis:     Text Interpretation:        Laboratory Studies: Results for orders placed or performed during the hospital encounter of 03/02/21 (from the past 24 hour(s))  Pregnancy, urine     Status: None   Collection Time: 03/02/21 12:08 AM  Result Value Ref Range   Preg Test, Ur NEGATIVE NEGATIVE   Imaging Studies: DG Chest 2 View  Result Date: 03/02/2021 CLINICAL DATA:  Chest pain. EXAM: CHEST - 2 VIEW COMPARISON:  None. FINDINGS: The heart size and mediastinal contours are within normal limits. No focal consolidation. No pleural effusion. No pneumothorax. The visualized skeletal structures are unremarkable. IMPRESSION: No active cardiopulmonary disease. Electronically Signed   By: 03/04/2021 MD   On: 03/02/2021 00:27    ED COURSE and MDM  Nursing notes, initial and subsequent vitals signs, including pulse oximetry, reviewed and interpreted by myself.  Vitals:   03/02/21 0145 03/02/21 0200 03/02/21 0215 03/02/21 0230  BP:  (!) 92/49  109/67  Pulse: 96 93 89 91  Resp:    18  Temp:      TempSrc:      SpO2: 100% 99% 100% 96%  Weight:      Height:       Medications - No data to display  Will send test for alpha-1 antitrypsin level.  Patient advised we will not get the results in the ED and she will have to follow-up with her PCP which she intends to do.  PROCEDURES  Procedures   ED DIAGNOSES     ICD-10-CM   1. Paroxysmal cough  R05.8   2. Atypical chest pain  R07.89   3. Concern about metabolic disease without diagnosis  Z71.1        Kentavious Michele, MD 03/02/21 [phone]

## 2021-03-03 LAB — ALPHA-1-ANTITRYPSIN: A-1 Antitrypsin, Ser: 108 mg/dL (ref 100–188)

## 2021-03-13 ENCOUNTER — Other Ambulatory Visit: Payer: Self-pay

## 2021-03-13 ENCOUNTER — Encounter: Payer: Medicaid Other | Attending: Pediatrics | Admitting: Registered"

## 2021-03-13 DIAGNOSIS — E7800 Pure hypercholesterolemia, unspecified: Secondary | ICD-10-CM | POA: Insufficient documentation

## 2021-03-13 NOTE — Patient Instructions (Signed)
Instructions/Goals:  Make sure to get in three meals per day. Try to have balanced meals like the My Plate example (see handout). Include lean proteins, vegetables, fruits, and whole grains at meals.    Have 3 meals: If unable to get a full meal, have a balanced snack (see handout). Eat every 3-5 hours while awake.  Continue working to get in 3 meals. Doing great!    Continue working to include more non-starchy vegetables. Try for at least 1-2 non-starchy vegetables per day and at least 1 fruit each day.   -Recommend trying a lactose free milk Continue working to get in more dairy! Great job!  -Recommend a daily multivitamin with iron. Great job!   -Recommend ~64 oz water (4 bottles) daily Continue doing well with water! Doing great!   -Continue working to increase physical activity.   -Make sure to include some stress relieving activities regularly, especially during high stress times. Good job identifying things you enjoy.

## 2021-03-13 NOTE — Progress Notes (Signed)
Medical Nutrition Therapy:  Appt start time: 1130 end time:  1200.  Assessment:  Primary concerns today: Pt referred for weight management and elevated cholesterol.   Nutrition Follow-Up: Pt present for appointment alone.  Pt reports she has been having many family health concerns lately. Grandfather has prostate cancer and had recent fall and stepfather's throat cancer has come back. Reports also mother having some health concerns. Pt was admitted to ER earlier this month after having chest pain. Reports she is unsure what caused her chest pains. Pt's antitrypsin levels were tested due to mother having deficiency. Pt's levels were WNL. Reports she was told she should have it checked periodically as it often does not show until later in life. Reports her chest pain and symptoms may have been an anxiety attack due to stress she has been dealing with. Pt also stressed about finishing school program exams.   Pt reports some things with her nutrition have faltered but she has been still eating 3 meals most days and water intake is going well. Reports now waking earlier which helps her get an appetite in morning to eat breakfast. Reports now going to bed around 09-1229 AM and waking 6-7 AM. Reports feeling rested most of the time. Reports struggling with fruits and vegetables. Reports has slacked off on those some and was doing some stress eating but only for about 1 week before she realized and stopped doing so. Reports she thought about how she wants to maintain her wt and not gain wt. Reports she sometimes reads or plays with younger sibling to relieve stress.   Reports she usually has dairy a couple times daily so no calcium supplement recently, but continues with multivitamin with iron.   Reports having worse cramps with periods than usual. She has talked with her gynocologist about this and will f/u if continues.   Food Allergies/Intolerances: No food allergies. Pt reports spicy, acidic, greasy/high  fat foods and carbonated drinks irritate GI. Limits amount of dairy at one time as well.   GI Concerns: None currently. Hx of SIBO and several foods that cause GI discomfort but reports feeling much better over past 1.5 years since making dietary changes. Reports no GI issues recently. Occasional acid reflux but reports taking Tums sometimes to manage.   Pertinent Lab Values:  11/15/19:  Hemoglobin: 11.9  Weight Hx: See growth chart.   Preferred Learning Style:   No preference indicated   Learning Readiness:   Ready  MEDICATIONS: See list.    DIETARY INTAKE:  Usual eating pattern includes 3 meals and 1-2 snacks per day.   Common foods: foods vary.  Avoided foods: chips (limits), spicy (limits), acidic (limits), greasy foods (limits), carbonated beverages (limits).  Liked vegetables: carrots, green beans, cucumbers.    Typical Snacks: popcorn, nuts, trail mix, granola bar, yogurt, gummies.   Typical Beverages: mostly water; juice; occasional soda.   Location of Meals: with family.  Electronics Present at [employer]: Yes: TV in background  24-hr recall:  B ( AM): pancakes x 2 with small amount syrup, 1 piece white toast, water  Snk ( AM): None reported.  L (PM): leftovers: hamburger helper, few carrots, cup sweet tea  Snk ( PM): handful peanuts  D ( PM): fish filets, pinto beans, mac and cheese, water  Snk ( PM): None reported.  Beverages: water, 1 cup sweet tea  Usual physical activity: cardio videos; Minutes/Week: Outside with dog some days (border collie/beagle, etc mix). Includes workout video 2 times/week x 15  minutes per week.   Progress Towards Goal(s):  Some progress.    Nutritional Diagnosis:  NI-5.11.1 Predicted suboptimal nutrient intake As related to skipping meals, inadequate intake of vegetables.  As evidenced by pt's reported dietary recall and habits .    Intervention:  Nutrition counseling provided. Praised pt for continuing with 3 meals most days and  doing well with water intake. Encouraged pt to continue working to include more fruits and vegetables. Encouraged pt to regularly include relaxing, stress relieving activities-pt reports having multiple things she can do to reduce stress. Pt reports having appointments helps her stay on track. Pt appeared agreeable to information/goals discussed.   Instructions/Goals:  Make sure to get in three meals per day. Try to have balanced meals like the My Plate example (see handout). Include lean proteins, vegetables, fruits, and whole grains at meals.    Have 3 meals: If unable to get a full meal, have a balanced snack (see handout). Eat every 3-5 hours while awake.  Continue working to get in 3 meals. Doing great!    Continue working to include more non-starchy vegetables. Try for at least 1-2 non-starchy vegetables per day and at least 1 fruit each day.   -Recommend trying a lactose free milk Continue working to get in more dairy! Great job!  -Recommend a daily multivitamin with iron. Great job!   -Recommend ~64 oz water (4 bottles) daily Continue doing well with water! Doing great!   -Continue working to increase physical activity.   -Make sure to include some stress relieving activities regularly, especially during high stress times. Good job identifying things you enjoy.   Teaching Method Utilized:  Visual Auditory  Barriers to learning/adherence to lifestyle change: None indicated.  Demonstrated degree of understanding via:  Teach Back   Monitoring/Evaluation:  Dietary intake, exercise,and body weight in 3 month(s).

## 2021-03-14 ENCOUNTER — Encounter: Payer: Self-pay | Admitting: Registered"

## 2021-05-06 ENCOUNTER — Ambulatory Visit
Admission: RE | Admit: 2021-05-06 | Discharge: 2021-05-06 | Disposition: A | Discharge status: Home / Self Care | Payer: Medicaid Other | Source: Ambulatory Visit | Attending: Emergency Medicine | Admitting: Emergency Medicine

## 2021-05-06 ENCOUNTER — Other Ambulatory Visit: Payer: Self-pay

## 2021-05-06 VITALS — BP 145/79 | HR 106 | Temp 98.4°F | Resp 20

## 2021-05-06 DIAGNOSIS — J0140 Acute pansinusitis, unspecified: Secondary | ICD-10-CM

## 2021-05-06 DIAGNOSIS — Z20822 Contact with and (suspected) exposure to covid-19: Secondary | ICD-10-CM

## 2021-05-06 MED ORDER — IBUPROFEN 600 MG PO TABS: 600.0000 mg | ORAL_TABLET | Freq: Four times a day (QID) | ORAL | 0 refills | Status: DC | PRN

## 2021-05-06 MED ORDER — AMOXICILLIN-POT CLAVULANATE 875-125 MG PO TABS: 1.0000 | ORAL_TABLET | Freq: Two times a day (BID) | ORAL | 0 refills | Status: DC

## 2021-05-06 MED ORDER — FLUTICASONE PROPIONATE 50 MCG/ACT NA SUSP: 2.0000 | Freq: Every day | NASAL | 0 refills | Status: AC

## 2021-05-06 MED ORDER — BENZONATATE 200 MG PO CAPS: 200.0000 mg | ORAL_CAPSULE | Freq: Three times a day (TID) | ORAL | 0 refills | Status: DC | PRN

## 2021-05-06 NOTE — ED Provider Notes (Signed)
HPI  SUBJECTIVE:  Linda Bailey is a 20 y.o. female who presents with 4 days of sinus pain and pressure, nasal congestion, rhinorrhea, cough, postnasal drip, sore throat secondary to the postnasal drip and cough.  She reports muffled hearing in both ears, no otorrhea or pain.  No fevers, body aches.  She reports occasional headaches that are consistent with migraines.  She states that her upper teeth hurt.  Occasional nausea.  No facial swelling, allergy symptoms, loss of sense of smell or taste, wheezing, shortness of breath, vomiting, diarrhea, abdominal pain.  She tried Sudafed which made her symptoms worse.  She has also tried Synex saline nasal spray, Mucinex.  No alleviating factors.  Symptoms are worse with bending forward, lying down, and with being hot.  No known COVID exposure.  She did not get the COVID-vaccine.  She has a past medical history of migraines and frequent sinusitis.  She has had 3 so far this year.  No history of COVID, allergies, diabetes, hypertension.  LMP: Now.  Denies possibility of being pregnant.  PMD: [facility].   Past Medical History:  Diagnosis Date   Migraines     Past Surgical History:  Procedure Laterality Date   KNEE ARTHROSCOPY WITH POSTERIOR CRUCIATE LIGAMENT (PCL) RECONSTRUCTION Right    KNEE SURGERY  09/23/2017    Family History  Problem Relation Age of Onset   Alpha-1 antitrypsin deficiency Mother    Cancer Mother    Hypertension Maternal Grandmother    Diabetes Maternal Grandmother    Hypertension Maternal Grandfather    Diabetes Maternal Grandfather    Hypertension Paternal Grandmother    Diabetes Paternal Grandmother    Hypertension Paternal Grandfather    Diabetes Paternal Grandfather    ADD / ADHD Sister    ADD / ADHD Brother    COPD Other    Hyperlipidemia Other    Heart attack Other    Migraines Neg Hx    Seizures Neg Hx    Depression Neg Hx    Anxiety disorder Neg Hx    Bipolar disorder Neg Hx    Schizophrenia Neg Hx     Autism Neg Hx     Social History   Tobacco Use   Smoking status: Never   Smokeless tobacco: Never  Vaping Use   Vaping Use: Never used  Substance Use Topics   Alcohol use: No   Drug use: No    No current facility-administered medications for this encounter.  Current Outpatient Medications:    amoxicillin-clavulanate (AUGMENTIN) 875-125 MG tablet, Take 1 tablet by mouth 2 (two) times daily. X 7 days, Disp: 14 tablet, Rfl: 0   benzonatate (TESSALON) 200 MG capsule, Take 1 capsule (200 mg total) by mouth 3 (three) times daily as needed for cough., Disp: 30 capsule, Rfl: 0   fluticasone (FLONASE) 50 MCG/ACT nasal spray, Place 2 sprays into both nostrils daily., Disp: 16 g, Rfl: 0   ibuprofen (ADVIL) 600 MG tablet, Take 1 tablet (600 mg total) by mouth every 6 (six) hours as needed., Disp: 30 tablet, Rfl: 0   Multiple Vitamins-Minerals (MULTIVITAMIN ADULTS PO), Take by mouth., Disp: , Rfl:    NORETHINDRONE PO, Take by mouth., Disp: , Rfl:    Norgestim-Eth Estrad Triphasic (NORGESTIMATE-ETHINYL ESTRADIOL PO), Take by mouth., Disp: , Rfl:    rizatriptan (MAXALT-MLT) 10 MG disintegrating tablet, Take 1 tablet (10 mg total) by mouth as needed for migraine. May repeat in 2 hours if needed, Disp: 9 tablet, Rfl: 3  Allergies  Allergen Reactions   Clindamycin/Lincomycin Rash   Sulfa Antibiotics Rash and Other (See Comments)    HEADACHE, FEVER headache      ROS  As noted in HPI.   Physical Exam  BP (!) 145/79   Pulse (!) 106   Temp 98.4 F (36.9 C) (Oral)   Resp 20   SpO2 97%   Constitutional: Well developed, well nourished, no acute distress Eyes:  EOMI, conjunctiva normal bilaterally HENT: Normocephalic, atraumatic,mucus membranes moist.  Normal bilaterally.  No TMJ tenderness.  Erythematous, swollen turbinates.  Mucoid nasal congestion.  Positive maxillary, frontal sinus tenderness.  Erythematous oropharynx.  Tonsils normal size without exudates.  Positive cobblestoning  and postnasal drip. Neck: Positive anterior cervical lymphadenopathy Respiratory: Normal inspiratory effort, lungs clear bilaterally Cardiovascular: Regular tachycardia GI: nondistended skin: No rash, skin intact Musculoskeletal: no deformities Neurologic: Alert & oriented x 3, no focal neuro deficits Psychiatric: Speech and behavior appropriate   ED Course   Medications - No data to display  Orders Placed This Encounter  Procedures   Covid-19, Flu A+B ([facility])    Standing Status:   Standing    Number of Occurrences:   1    No results found for this or any previous visit (from the past 24 hour(s)). No results found. Results for orders placed or performed during the hospital encounter of 05/06/21  Covid-19, Flu A+B ([facility])   Specimen: Nasopharyngeal   Naso  Result Value Ref Range   SARS-CoV-2, NAA Not Detected Not Detected   Influenza A, NAA Not Detected Not Detected   Influenza B, NAA Not Detected Not Detected   Test Information: Comment      ED Clinical Impression  1. Acute pansinusitis, recurrence not specified   2. Encounter for laboratory testing for COVID-19 virus      ED Assessment/Plan  COVID, flu sent.  She may be within the treatment window for COVID if it comes back within the next 24 hours.  She is not vaccinated.  We will send home with Augmentin.  She is to start this if her COVID is negative.  She has MyChart.  Also saline nasal irrigation, Tylenol/ibuprofen, Flonase, Mucinex D, Tessalon.  Follow-up with ENT because this is her fourth sinus infection in 7 months.  [facility] on-call.  COVID, flu negative. Plan as above.  Discussed labs, imaging, MDM, treatment plan, and plan for follow-up with patient. patient agrees with plan.   Meds ordered this encounter  Medications   amoxicillin-clavulanate (AUGMENTIN) 875-125 MG tablet    Sig: Take 1 tablet by mouth 2 (two) times daily. X 7 days    Dispense:  14 tablet    Refill:  0   fluticasone  (FLONASE) 50 MCG/ACT nasal spray    Sig: Place 2 sprays into both nostrils daily.    Dispense:  16 g    Refill:  0   ibuprofen (ADVIL) 600 MG tablet    Sig: Take 1 tablet (600 mg total) by mouth every 6 (six) hours as needed.    Dispense:  30 tablet    Refill:  0   benzonatate (TESSALON) 200 MG capsule    Sig: Take 1 capsule (200 mg total) by mouth 3 (three) times daily as needed for cough.    Dispense:  30 capsule    Refill:  0      *This clinic note was created using Scientist, clinical (histocompatibility and immunogenetics). Therefore, there may be occasional mistakes despite careful proofreading.  ?  Domenick Gong, MD 05/08/21 [phone]

## 2021-05-06 NOTE — ED Triage Notes (Signed)
Pt here with sore throat, bilateral ear pain and sinus congestion and pressure since Friday.

## 2021-05-06 NOTE — Discharge Instructions (Signed)
Start Mucinex-D to keep the mucous thin and to decongest you.   You may take 600 mg of motrin with 1000 mg of tylenol up to 3-4 times a day as needed for pain. This is an effective combination for pain.  Most sinus infections are viral and do not need antibiotics unless you have a high fever, have had this for 10 days, or you get better and then get sick again.  If your COVID test is positive, then I would wait to start the Augmentin.  If it is negative, go ahead and start it.  Use a NeilMed sinus rinse with distilled water as often as you want to to reduce nasal congestion. Follow the directions on the box.   Go to www.goodrx.com to look up your medications. This will give you a list of where you can find your prescriptions at the most affordable prices. Or you can ask the pharmacist what the cash price is. This is frequently cheaper than going through insurance.

## 2021-05-07 LAB — COVID-19, FLU A+B NAA
Influenza A, NAA: NOT DETECTED
Influenza B, NAA: NOT DETECTED
SARS-CoV-2, NAA: NOT DETECTED

## 2021-06-12 ENCOUNTER — Ambulatory Visit: Payer: Medicaid Other | Admitting: Registered"

## 2021-06-22 ENCOUNTER — Encounter (HOSPITAL_BASED_OUTPATIENT_CLINIC_OR_DEPARTMENT_OTHER): Payer: Self-pay | Admitting: Emergency Medicine

## 2021-06-22 ENCOUNTER — Emergency Department (HOSPITAL_BASED_OUTPATIENT_CLINIC_OR_DEPARTMENT_OTHER): Payer: Medicaid Other

## 2021-06-22 ENCOUNTER — Emergency Department (HOSPITAL_BASED_OUTPATIENT_CLINIC_OR_DEPARTMENT_OTHER)
Admission: EM | Admit: 2021-06-22 | Discharge: 2021-06-23 | Disposition: A | Discharge status: Home / Self Care | Payer: Medicaid Other | Attending: Emergency Medicine | Admitting: Emergency Medicine

## 2021-06-22 DIAGNOSIS — S39012A Strain of muscle, fascia and tendon of lower back, initial encounter: Secondary | ICD-10-CM | POA: Insufficient documentation

## 2021-06-22 DIAGNOSIS — S301XXA Contusion of abdominal wall, initial encounter: Secondary | ICD-10-CM | POA: Insufficient documentation

## 2021-06-22 DIAGNOSIS — S34109A Unspecified injury to unspecified level of lumbar spinal cord, initial encounter: Secondary | ICD-10-CM | POA: Diagnosis present

## 2021-06-22 LAB — PREGNANCY, URINE: Preg Test, Ur: NEGATIVE

## 2021-06-22 MED ORDER — NAPROXEN 250 MG PO TABS
500.0000 mg | ORAL_TABLET | Freq: Once | ORAL | Status: AC
  Administered 2021-06-23: 500 mg via ORAL
  Filled 2021-06-22: qty 2
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
## 2021-06-22 NOTE — ED Notes (Signed)
Imaging tbd s/p preg test results. 
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
## 2021-06-23 MED ORDER — NAPROXEN 375 MG PO TABS: ORAL_TABLET | ORAL | 0 refills | Status: AC

## 2021-07-24 ENCOUNTER — Other Ambulatory Visit: Payer: Self-pay

## 2021-07-24 ENCOUNTER — Encounter: Payer: Medicaid Other | Attending: Pediatrics | Admitting: Registered"

## 2021-07-24 ENCOUNTER — Encounter: Payer: Self-pay | Admitting: Registered"

## 2021-07-24 DIAGNOSIS — E7800 Pure hypercholesterolemia, unspecified: Secondary | ICD-10-CM | POA: Insufficient documentation

## 2021-07-24 DIAGNOSIS — E669 Obesity, unspecified: Secondary | ICD-10-CM | POA: Diagnosis not present

## 2021-07-24 NOTE — Patient Instructions (Addendum)
Instructions/Goals:  Make sure to get in three meals per day. Try to have balanced meals like the My Plate example (see handout). Include lean proteins, vegetables, fruits, and whole grains at meals.   Have 3 meals: If unable to get a full meal, have a balanced snack (see handout). Eat every 3-5 hours while awake. Continue working to get in 3 meals. Doing great!   Continue working to include more non-starchy vegetables. Try for at least 1-2 non-starchy vegetables per day and at least 1 fruit each day.   -Recommend trying a lactose free milk Continue working to get in more dairy! Great job!  -Recommend a daily multivitamin with iron. Great job!   -Recommend ~64 oz water (4 bottles) daily Continue doing well with water! Doing great!   -Continue working to increase physical activity once you recover completely from the stomach bug.

## 2021-07-24 NOTE — Progress Notes (Signed)
Medical Nutrition Therapy:  Appt start time: 1102 end time:  1130.  Assessment:  Primary concerns today: Pt referred for weight management and elevated cholesterol.   Nutrition Follow-Up: Pt present for appointment alone.  Reports feeling stressed lately. Trying to go for healthier foods over comfort foods. Reports she took her exam 2 months ago and didn't pass which has stressed her. Moving soon but setbacks with the house. Stepfather getting surgery soon for cancer, hoping it will be in November but reports it keeps getting moved. Pt trying to wait until after his surgery to take her exam so she will be available to help mother with kids during that time. Pt got engaged in April. Reports she and fiancee have known each other since 9th grade.   Pt reports for a while she was doing 3 meals per day until recent stomach bug. Reports being on sleep schedule has really helped, latest she wakes now is around 9 AM. Reports less snacking since eating more consistent and sleeping more consistent as well. Reports water going well, drinking about 4 bottles water. Tries to have vegetables most days (cucumbers, carrots, green beans, corn). Reports no GI issues apart from recent bug unless she eats something that usually triggers her GI issues and over does it. Reports dairy has bothered her some recently but only since her stomach bug.  Pt continues with multivitamin with iron.    Food Allergies/Intolerances: No food allergies. Pt reports spicy, acidic, greasy/high fat foods and carbonated drinks irritate GI. Limits amount of dairy at one time as well.   GI Concerns: None currently. Hx of SIBO and several foods that cause GI discomfort but reports feeling much better over past 1.5 years since making dietary changes. Reports no GI issues recently. Occasional acid reflux but reports taking Tums sometimes to manage.   Pertinent Lab Values:  11/15/19:  Hemoglobin: 11.9  Weight Hx: See growth chart.   Preferred  Learning Style:  No preference indicated   Learning Readiness:  Ready  MEDICATIONS: See list.    DIETARY INTAKE:  Usual eating pattern includes 3 meals and 1-2 snacks per day.   Common foods: foods vary.  Avoided foods: chips (limits), spicy (limits), acidic (limits), greasy foods (limits), carbonated beverages (limits).  Liked vegetables: carrots, green beans, cucumbers.    Typical Snacks: popcorn, nuts, trail mix, granola bar, yogurt, gummies.   Typical Beverages: mostly water; juice; occasional soda.   Location of Meals: with family.  Electronics Present at [employer]: Yes: TV in background  24-hr recall:  B ( AM): None reported.  Snk ( AM): None reported.  L (PM): leftover chili beans, saltine crackers, water  Snk ( PM): None reported.  D ( PM): mashed potatoes, beef stew, carrots, potato chunks, Sprite  Snk ( PM): small bowl fudge ripple ice cream  Beverages: water  Usual physical activity:  Minutes/Week: Outside with dog some days (border collie/beagle, etc mix).   Progress Towards Goal(s):  Some progress.    Nutritional Diagnosis:  NI-5.11.1 Predicted suboptimal nutrient intake As related to skipping meals, inadequate intake of vegetables.  As evidenced by pt's reported dietary recall and habits .    Intervention:  Nutrition counseling provided. Praised pt for continuing to work on 3 meals per day and meeting water goal. Encouraged pt to continue with working in vegetables and ongoing goals. Pt appeared agreeable to information/goals discussed.   Instructions/Goals:  Make sure to get in three meals per day. Try to have balanced meals like the  My Plate example (see handout). Include lean proteins, vegetables, fruits, and whole grains at meals.   Have 3 meals: If unable to get a full meal, have a balanced snack (see handout). Eat every 3-5 hours while awake. Continue working to get in 3 meals. Doing great!   Continue working to include more non-starchy vegetables.  Try for at least 1-2 non-starchy vegetables per day and at least 1 fruit each day.   -Recommend trying a lactose free milk Continue working to get in more dairy! Great job!  -Recommend a daily multivitamin with iron. Great job!   -Recommend ~64 oz water (4 bottles) daily Continue doing well with water! Doing great!   -Continue working to increase physical activity once you recover completely from the stomach bug.   Teaching Method Utilized:  Visual Auditory  Barriers to learning/adherence to lifestyle change: None indicated.  Demonstrated degree of understanding via:  Teach Back   Monitoring/Evaluation:  Dietary intake, exercise,and body weight in 3 month(s).

## 2021-10-23 ENCOUNTER — Ambulatory Visit: Payer: Medicaid Other | Admitting: Registered"

## 2021-12-11 ENCOUNTER — Encounter: Payer: Medicaid Other | Attending: *Deleted | Admitting: Registered"

## 2021-12-11 ENCOUNTER — Other Ambulatory Visit: Payer: Self-pay

## 2021-12-11 DIAGNOSIS — E7800 Pure hypercholesterolemia, unspecified: Secondary | ICD-10-CM | POA: Insufficient documentation

## 2021-12-11 NOTE — Progress Notes (Signed)
Medical Nutrition Therapy:  Appt start time: 1145 end time:  1220.  Assessment:  Primary concerns today: Pt referred for weight management and elevated cholesterol.   Nutrition Follow-Up: Pt present for appointment alone.   Pt reports since last appointment she found out she is pregnant and is now [redacted] weeks pregnant. Reports pt was feeling uneasy at first but is now excited. Reports she finds out baby's gender next month. Reports she will be happy with either, everyone thinks it is a boy. Reports she is now past morning sickness. Reports having some headaches in second trimester. Reports it helps when she eats or drinks more water. Taking prenatal multivitamin and aspirin (reports doctor cleared her to take aspirin due to medical hx). Reports much more appetite now, eating 3 meals plus snack. Has been drinking 42 or more oz water daily. Knows she needs more and sometimes having constipation. Otherwise doing sweet tea, orange juice and lemonade. Reports gradually working foods back into diet which made her throw up during 1st trimester. Reports some fruit (grapes, oranges, bananas) and hot dogs in this category (make her still feel nauseous). Pt reports she was given information about foods to avoid and limit during pregnancy but additional information is helpful. Pt aware of need to heat lunch meats, hot dogs until very hot/steaming. Pt reports she has been limiting her caffeine intake to no more than 200 mg per day. Pt reports dairy has been giving her heartburn sometimes.   Pt reports she was told her insurance would not cover any appointments after March. Pt wants to know if she needs to come back after today's appointment.   Food Allergies/Intolerances: No food allergies. Pt reports spicy, acidic, greasy/high fat foods and carbonated drinks irritate GI. Limits amount of dairy at one time as well.   GI Concerns: Heartburn sometimes since pregnancy, reports especially with dairy. Has discussed which  medications she can take with her obgyn. Hx of SIBO and several foods that cause GI discomfort but reports feeling much better over past 1.5 years since making dietary changes.   Pertinent Lab Values:  11/15/19:  Hemoglobin: 11.9  Preferred Learning Style:  No preference indicated   Learning Readiness:  Ready  MEDICATIONS: See list.    DIETARY INTAKE:  Usual eating pattern includes 3 meals and 1-2 snacks per day.   Common foods: foods vary.  Avoided foods: chips (limits), spicy (limits), acidic (limits), greasy foods (limits), carbonated beverages (limits).  Liked vegetables: carrots, green beans, cucumbers.    Typical Snacks: popcorn, nuts, trail mix, granola bar, yogurt, gummies.   Typical Beverages: mostly water; juice sweet tea; occasional soda.   Location of Meals: with family.  Electronics Present at [employer]: Yes: TV in background  24-hr recall:  B ( AM): Frosted Flakes cereal with 2% milk  Snk ( AM): Ritz Crackers, water  L (PM): tomato soup, water  Snk ( PM): None reported.  D ( PM): Popcorn chicken, fries, sweet tea  Snk ( PM): ice cream sandwich, water  Beverages:   Usual physical activity:  Minutes/Week: Outside with dog some days (border collie/beagle, etc mix).   Progress Towards Goal(s):  Some progress.    Nutritional Diagnosis:  NI-5.11.1 Predicted suboptimal nutrient intake As related to skipping meals, inadequate intake of vegetables.  As evidenced by pt's reported dietary recall and habits .    Intervention:  Nutrition counseling provided. Praised pt for eating consistently and working on goals. Discussed continued goals and provided education regarding special nutrition considerations/recommendations  during pregnancy. Recommend trying lactose free milk to see if this sits better on stomach. Discussed increased water needs with pregnancy and this will help constipation. Continue prenatal vitamin. Encouraged pt to reach out if additional questions or  concerns. Pt has done well with making progress on goals and appears comfortable with continuing with working toward goals on her own. Pt appeared agreeable to information/goals discussed.   Instructions/Goals:  Make sure to get in three meals per day. Try to have balanced meals like the My Plate example (see handout). Include lean proteins, vegetables, fruits, and whole grains at meals.   Have 3 meals: If unable to get a full meal, have a balanced snack (see handout). Eat every 3-4 hours while awake. Continue working to get in 3 meals and having 1 snack between each meal.  Have a balance at meals, ensure protein + starch + vegetable and/or fruit with each meal. Balance goals are the same, however even more important during pregnancy.  Look for 60% or more iron DV in cereals and 3 g or more fiber   Goal: Vegetables at least 2-3 times daily and fruit at least 2-3 times daily.   See handout for additional information regarding foods to limit and/or avoid.   -Recommend trying a lactose free milk   -Recommend ~64 oz water (4 bottles) daily minimum, preferably 80 or more. Daily fluid needs overall at least 96 oz. Try to limit sweet tea as much as possible and other caffeinated beverages.  -Continue with prenatal multivitamin tablet   Teaching Method Utilized:  Visual Auditory  Barriers to learning/adherence to lifestyle change: None indicated.  Demonstrated degree of understanding via:  Teach Back   Monitoring/Evaluation:  Dietary intake, exercise,and body weight prn. Contact information provided.

## 2021-12-11 NOTE — Patient Instructions (Addendum)
Instructions/Goals:  Make sure to get in three meals per day. Try to have balanced meals like the My Plate example (see handout). Include lean proteins, vegetables, fruits, and whole grains at meals.   Have 3 meals: If unable to get a full meal, have a balanced snack (see handout). Eat every 3-4 hours while awake. Continue working to get in 3 meals and having 1 snack between each meal.  Have a balance at meals, ensure protein + starch + vegetable and/or fruit with each meal. Balance goals are the same, however even more important during pregnancy.  Look for 60% or more iron DV in cereals and 3 g or more fiber   Goal: Vegetables at least 2-3 times daily and fruit at least 2-3 times daily.   See handout for additional information regarding foods to limit and/or avoid.   -Recommend trying a lactose free milk   -Recommend ~64 oz water (4 bottles) daily minimum, preferably 80 or more. Daily fluid needs overall all at least 96 oz. Try to limit sweet tea as much as possible and other caffeinated beverages   -Continue with prenatal multivitamin tablet

## 2021-12-17 ENCOUNTER — Encounter: Payer: Self-pay | Admitting: Registered"
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
